# Patient Record
Sex: Male | Born: 1965 | Race: White | Hispanic: No | Marital: Married | Smoking: Never smoker
Health system: Southern US, Community
[De-identification: ages and names within clinical notes are randomized; demographics above are authoritative.]

## PROBLEM LIST (undated history)

## (undated) DIAGNOSIS — E785 Hyperlipidemia, unspecified: Secondary | ICD-10-CM

## (undated) HISTORY — PX: NOSE SURGERY: SHX723

---

## 2010-05-24 ENCOUNTER — Emergency Department: Payer: Self-pay | Admitting: Unknown Physician Specialty
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
## 2016-03-19 ENCOUNTER — Encounter: Payer: Self-pay | Admitting: *Deleted

## 2016-03-20 ENCOUNTER — Encounter
Admission: RE | Disposition: A | Discharge status: Home / Self Care | Payer: Self-pay | Source: Ambulatory Visit | Attending: Unknown Physician Specialty

## 2016-03-20 ENCOUNTER — Ambulatory Visit
Admission: RE | Admit: 2016-03-20 | Discharge: 2016-03-20 | Disposition: A | Discharge status: Home / Self Care | Payer: Managed Care, Other (non HMO) | Source: Ambulatory Visit | Attending: Unknown Physician Specialty | Admitting: Unknown Physician Specialty

## 2016-03-20 ENCOUNTER — Ambulatory Visit: Payer: Managed Care, Other (non HMO) | Admitting: Certified Registered Nurse Anesthetist

## 2016-03-20 ENCOUNTER — Encounter: Payer: Self-pay | Admitting: *Deleted

## 2016-03-20 DIAGNOSIS — Z1211 Encounter for screening for malignant neoplasm of colon: Secondary | ICD-10-CM | POA: Diagnosis present

## 2016-03-20 DIAGNOSIS — Z79899 Other long term (current) drug therapy: Secondary | ICD-10-CM | POA: Diagnosis not present

## 2016-03-20 DIAGNOSIS — K640 First degree hemorrhoids: Secondary | ICD-10-CM | POA: Diagnosis not present

## 2016-03-20 DIAGNOSIS — E785 Hyperlipidemia, unspecified: Secondary | ICD-10-CM | POA: Diagnosis not present

## 2016-03-20 DIAGNOSIS — K5730 Diverticulosis of large intestine without perforation or abscess without bleeding: Secondary | ICD-10-CM | POA: Insufficient documentation

## 2016-03-20 HISTORY — PX: COLONOSCOPY WITH PROPOFOL: SHX5780

## 2016-03-20 HISTORY — DX: Hyperlipidemia, unspecified: E78.5

## 2016-03-20 SURGERY — COLONOSCOPY WITH PROPOFOL
Anesthesia: General

## 2016-03-20 MED ORDER — MIDAZOLAM HCL 2 MG/2ML IJ SOLN
INTRAMUSCULAR | Status: DC | PRN
  Administered 2016-03-20: 1 mg via INTRAVENOUS

## 2016-03-20 MED ORDER — LIDOCAINE HCL (CARDIAC) 20 MG/ML IV SOLN
INTRAVENOUS | Status: DC | PRN
  Administered 2016-03-20: 50 mg via INTRAVENOUS

## 2016-03-20 MED ORDER — PROPOFOL 500 MG/50ML IV EMUL
INTRAVENOUS | Status: DC | PRN
  Administered 2016-03-20: 140 ug/kg/min via INTRAVENOUS

## 2016-03-20 MED ORDER — SODIUM CHLORIDE 0.9 % IV SOLN: INTRAVENOUS | Status: DC

## 2016-03-20 MED ORDER — PROPOFOL 10 MG/ML IV BOLUS
INTRAVENOUS | Status: DC | PRN
  Administered 2016-03-20: 30 mg via INTRAVENOUS
  Administered 2016-03-20 (×2): 20 mg via INTRAVENOUS

## 2016-03-20 MED ORDER — SODIUM CHLORIDE 0.9 % IV SOLN
INTRAVENOUS | Status: DC
  Administered 2016-03-20: 1000 mL via INTRAVENOUS

## 2016-03-20 NOTE — Op Note (Signed)
[facility] Gastroenterology Patient Name: Bobby Davis Procedure Date: 03/20/2016 10:23 AM MRN: [id number] Account #: [id number] Date of Birth: [birth year] Admit Type: Outpatient Age: [age] [facility] Gender: Male Note Status: Finalized Procedure:            Colonoscopy Indications:          Screening for colorectal malignant neoplasm Providers:            Scot Junobert T. Khye Hochstetler, MD Referring MD:         No Local Md, MD (Referring MD) Medicines:            Propofol per Anesthesia Complications:        No immediate complications. Procedure:            Pre-Anesthesia Assessment:                       - After reviewing the risks and benefits, the patient                        was deemed in satisfactory condition to undergo the                        procedure.                       After obtaining informed consent, the colonoscope was                        passed under direct vision. Throughout the procedure,                        the patient's blood pressure, pulse, and oxygen                        saturations were monitored continuously. The                        Colonoscope was introduced through the anus and                        advanced to the the cecum, identified by appendiceal                        orifice and ileocecal valve. The colonoscopy was                        performed without difficulty. The patient tolerated the                        procedure well. The quality of the bowel preparation                        was excellent. Findings:      A few small-mouthed diverticula were found in the sigmoid colon.      Internal hemorrhoids were found during endoscopy. The hemorrhoids were       small and Grade I (internal hemorrhoids that do not prolapse).      The exam was otherwise without abnormality. Impression:           - Diverticulosis in the sigmoid colon.                       -  Internal hemorrhoids.                       - The  examination was otherwise normal.                       - No specimens collected. Recommendation:       - Repeat colonoscopy in 10 years for screening purposes. Scot Jun, MD 03/20/2016 10:42:53 AM This report has been signed electronically. Number of Addenda: 0 Note Initiated On: 03/20/2016 10:23 AM Scope Withdrawal Time: 0 hours 6 minutes 23 seconds  Total Procedure Duration: 0 hours 11 minutes 30 seconds       [facility]

## 2016-03-20 NOTE — Transfer of Care (Signed)
Immediate Anesthesia Transfer of Care Note  Patient: Bobby Davis  Procedure(s) Performed: Procedure(s): COLONOSCOPY WITH PROPOFOL (N/A)  Patient Location: PACU  Anesthesia Type:General  Level of Consciousness: sedated  Airway & Oxygen Therapy: Patient Spontanous Breathing and Patient connected to nasal cannula oxygen  Post-op Assessment: Report given to RN and Post -op Vital signs reviewed and stable  Post vital signs: Reviewed and stable  Last Vitals:  Vitals:   03/20/16 0917 03/20/16 1050  BP: 119/80 100/69  Pulse: 65 73  Resp: 16 (!) 8  Temp: 36.3 C (!) 35.9 C    Last Pain:  Vitals:   03/20/16 1050  TempSrc: Oral         Complications: No apparent anesthesia complications

## 2016-03-20 NOTE — Anesthesia Procedure Notes (Signed)
Date/Time: 03/20/2016 10:24 AM Performed by: Ginger CarneMICHELET, Nesta Kimple Pre-anesthesia Checklist: Patient identified, Emergency Drugs available, Suction available, Patient being monitored and Timeout performed Patient Re-evaluated:Patient Re-evaluated prior to inductionOxygen Delivery Method: Nasal cannula

## 2016-03-20 NOTE — Anesthesia Postprocedure Evaluation (Signed)
Anesthesia Post Note  Patient: Bobby Davis  Procedure(s) Performed: Procedure(s) (LRB): COLONOSCOPY WITH PROPOFOL (N/A)  Patient location during evaluation: Endoscopy Anesthesia Type: General Level of consciousness: awake and alert Pain management: pain level controlled Vital Signs Assessment: post-procedure vital signs reviewed and stable Respiratory status: spontaneous breathing, nonlabored ventilation, respiratory function stable and patient connected to nasal cannula oxygen Cardiovascular status: blood pressure returned to baseline and stable Postop Assessment: no signs of nausea or vomiting Anesthetic complications: no    Last Vitals:  Vitals:   03/20/16 1110 03/20/16 1120  BP: 114/88 113/76  Pulse: 76 69  Resp: 16 14  Temp:      Last Pain:  Vitals:   03/20/16 1050  TempSrc: Oral                 Lenard SimmerAndrew Yogi Arther

## 2016-03-20 NOTE — Anesthesia Preprocedure Evaluation (Signed)
Anesthesia Evaluation  Patient identified by MRN, date of birth, ID band Patient awake    Reviewed: Allergy & Precautions, H&P , NPO status , Patient's Chart, lab work & pertinent test results, reviewed documented beta blocker date and time   Airway Mallampati: I  TM Distance: >3 FB Neck ROM: full    Dental no notable dental hx.    Pulmonary neg pulmonary ROS,    Pulmonary exam normal breath sounds clear to auscultation       Cardiovascular Exercise Tolerance: Good negative cardio ROS Normal cardiovascular exam Rhythm:regular Rate:Normal     Neuro/Psych negative neurological ROS  negative psych ROS   GI/Hepatic Neg liver ROS, GERD  ,  Endo/Other  negative endocrine ROS  Renal/GU negative Renal ROS  negative genitourinary   Musculoskeletal   Abdominal   Peds  Hematology negative hematology ROS (+)   Anesthesia Other Findings Past Medical History: No date: Hyperlipidemia   Reproductive/Obstetrics negative OB ROS                             Anesthesia Physical Anesthesia Plan  ASA: II  Anesthesia Plan: General   Post-op Pain Management:    Induction:   Airway Management Planned:   Additional Equipment:   Intra-op Plan:   Post-operative Plan:   Informed Consent: I have reviewed the patients History and Physical, chart, labs and discussed the procedure including the risks, benefits and alternatives for the proposed anesthesia with the patient or authorized representative who has indicated his/her understanding and acceptance.   Dental Advisory Given  Plan Discussed with: Anesthesiologist, CRNA and Surgeon  Anesthesia Plan Comments:         Anesthesia Quick Evaluation

## 2016-03-20 NOTE — H&P (Signed)
   Primary Care Physician:  No primary care provider on file. Primary Gastroenterologist:  Dr. Mechele CollinElliott  Pre-Procedure History & Physical: HPI:  Bobby Davis is a 50 y.o. male is here for an colonoscopy.   Past Medical History:  Diagnosis Date  . Hyperlipidemia     Past Surgical History:  Procedure Laterality Date  . NOSE SURGERY      Prior to Admission medications   Medication Sig Start Date End Date Taking? Authorizing Provider  atorvastatin (LIPITOR) 10 MG tablet Take 10 mg by mouth daily.   Yes Historical Provider, MD  multivitamin (ONE-A-DAY MEN'S) TABS tablet Take 1 tablet by mouth daily.   Yes Historical Provider, MD  Omega-3 Fatty Acids (OMEGA-3 FISH OIL PO) Take 1 capsule by mouth daily.   Yes Historical Provider, MD  vitamin B-12 (CYANOCOBALAMIN) 1000 MCG tablet Take 1,000 mcg by mouth daily.   Yes Historical Provider, MD    Allergies as of 02/11/2016  . (Not on File)    History reviewed. No pertinent family history.  Social History   Social History  . Marital status: Single    Spouse name: N/A  . Number of children: N/A  . Years of education: N/A   Occupational History  . Not on file.   Social History Main Topics  . Smoking status: Never Smoker  . Smokeless tobacco: Current User    Types: Chew  . Alcohol use No  . Drug use: No  . Sexual activity: Not on file   Other Topics Concern  . Not on file   Social History Narrative  . No narrative on file    Review of Systems: See HPI, otherwise negative ROS  Physical Exam: BP 119/80   Pulse 65   Temp 97.4 F (36.3 C) (Tympanic)   Resp 16   Ht 5\' 9"  (1.753 m)   Wt 81.6 kg (180 lb)   SpO2 99%   BMI 26.58 kg/m  General:   Alert,  pleasant and cooperative in NAD Head:  Normocephalic and atraumatic. Neck:  Supple; no masses or thyromegaly. Lungs:  Clear throughout to auscultation.    Heart:  Regular rate and rhythm. Abdomen:  Soft, nontender and nondistended. Normal bowel sounds, without  guarding, and without rebound.   Neurologic:  Alert and  oriented x4;  grossly normal neurologically.  Impression/Plan: Bobby Davis is here for an colonoscopy to be performed for screening  Risks, benefits, limitations, and alternatives regarding  colonoscopy have been reviewed with the patient.  Questions have been answered.  All parties agreeable.   Lynnae PrudeELLIOTT, Bobby Zito, MD  03/20/2016, 10:20 AM

## 2016-03-23 ENCOUNTER — Encounter: Payer: Self-pay | Admitting: Unknown Physician Specialty

## 2023-09-10 ENCOUNTER — Ambulatory Visit: Payer: BC Managed Care – PPO | Admitting: Podiatry

## 2023-10-06 ENCOUNTER — Other Ambulatory Visit: Payer: Self-pay | Admitting: Student

## 2023-10-06 ENCOUNTER — Other Ambulatory Visit: Payer: Self-pay

## 2023-10-06 ENCOUNTER — Ambulatory Visit
Admission: RE | Admit: 2023-10-06 | Discharge: 2023-10-06 | Disposition: A | Discharge status: Home / Self Care | Source: Ambulatory Visit | Attending: Student | Admitting: *Deleted

## 2023-10-06 ENCOUNTER — Ambulatory Visit
Admission: RE | Admit: 2023-10-06 | Discharge: 2023-10-06 | Disposition: A | Discharge status: Home / Self Care | Source: Ambulatory Visit | Attending: Student | Admitting: Student

## 2023-10-06 DIAGNOSIS — S67193A Crushing injury of left middle finger, initial encounter: Secondary | ICD-10-CM | POA: Diagnosis present

## 2023-10-06 DIAGNOSIS — X58XXXA Exposure to other specified factors, initial encounter: Secondary | ICD-10-CM | POA: Diagnosis not present

## 2023-10-06 DIAGNOSIS — S62631A Displaced fracture of distal phalanx of left index finger, initial encounter for closed fracture: Secondary | ICD-10-CM | POA: Insufficient documentation

## 2023-10-06 DIAGNOSIS — Y990 Civilian activity done for income or pay: Secondary | ICD-10-CM | POA: Insufficient documentation

## 2023-10-06 DIAGNOSIS — S6722XA Crushing injury of left hand, initial encounter: Secondary | ICD-10-CM | POA: Diagnosis present

## 2023-10-06 DIAGNOSIS — S62633A Displaced fracture of distal phalanx of left middle finger, initial encounter for closed fracture: Secondary | ICD-10-CM | POA: Insufficient documentation

## 2023-10-06 DIAGNOSIS — M79645 Pain in left finger(s): Secondary | ICD-10-CM | POA: Diagnosis not present

## 2023-10-06 DIAGNOSIS — S67191A Crushing injury of left index finger, initial encounter: Secondary | ICD-10-CM | POA: Insufficient documentation
